# Patient Record
Sex: Female | Born: 1940
Health system: Southern US, Community
[De-identification: ages and names within clinical notes are randomized; demographics above are authoritative.]

## PROBLEM LIST (undated history)

## (undated) DIAGNOSIS — I442 Atrioventricular block, complete: Secondary | ICD-10-CM

## (undated) HISTORY — DX: Atrioventricular block, complete: I44.2

---

## 2012-06-29 DEATH — deceased

## 2015-03-15 ENCOUNTER — Ambulatory Visit (INDEPENDENT_AMBULATORY_CARE_PROVIDER_SITE_OTHER): Payer: Medicare HMO | Admitting: Sports Medicine

## 2015-03-15 ENCOUNTER — Ambulatory Visit (INDEPENDENT_AMBULATORY_CARE_PROVIDER_SITE_OTHER): Payer: Medicare HMO

## 2015-03-15 ENCOUNTER — Encounter: Payer: Self-pay | Admitting: Sports Medicine

## 2015-03-15 VITALS — BP 152/88 | HR 68 | Ht 64.0 in | Wt 139.0 lb

## 2015-03-15 DIAGNOSIS — M25511 Pain in right shoulder: Secondary | ICD-10-CM

## 2015-03-15 DIAGNOSIS — M8580 Other specified disorders of bone density and structure, unspecified site: Secondary | ICD-10-CM

## 2015-03-15 DIAGNOSIS — M810 Age-related osteoporosis without current pathological fracture: Secondary | ICD-10-CM

## 2015-03-15 MED ORDER — MELOXICAM 15 MG PO TABS
ORAL_TABLET | ORAL | Status: DC
Start: 2015-03-15 — End: 2017-10-06

## 2015-03-15 MED ORDER — CALCIUM CARBONATE-VITAMIN D 600-400 MG-UNIT PO TABS: 1.0000 | ORAL_TABLET | Freq: Two times a day (BID) | ORAL | Status: AC

## 2015-03-15 NOTE — Progress Notes (Unsigned)
Patient called back this afternoon asking if she couldn't bowl in her tournament on tomorrow. She stated that she does have a physical therapy here on Tuesday. I read her the chart review: We are going to proceed with formal physical therapy, I have asked her to avoid bowling for the next week. She sound very hesitate if she wouldn't bowl. I asked her Please not to that she may cause herself more damage  & that would present a whole other set of problems which depending on the damage could include surgery. Then she replied that she would not bowl & wait to start her physical therapy on tuesday

## 2015-03-15 NOTE — Progress Notes (Signed)
   Subjective:    I'm seeing this patient as a consultation for:  Dr. Paris Lore  CC: Right shoulder pain  HPI: This is an exquisitely pleasant 74 year old female avid bowler, she bowls in a league. A couple of days ago during the forward stroke/acceleration phase she felt a pop in her superior and anterior shoulder with resultant pain, and persistent mechanical symptoms, catching, popping. Pain is moderate, persistent, and localized anteriorly. No radicular symptoms, no neck pain.  Past medical history, Surgical history, Family history not pertinant except as noted below, Social history, Allergies, and medications have been entered into the medical record, reviewed, and no changes needed.   Review of Systems: No headache, visual changes, nausea, vomiting, diarrhea, constipation, dizziness, abdominal pain, skin rash, fevers, chills, night sweats, weight loss, swollen lymph nodes, body aches, joint swelling, muscle aches, chest pain, shortness of breath, mood changes, visual or auditory hallucinations.   Objective:   General: Well Developed, well nourished, and in no acute distress.  Neuro/Psych: Alert and oriented x3, extra-ocular muscles intact, able to move all 4 extremities, sensation grossly intact. Skin: Warm and dry, no rashes noted.  Respiratory: Not using accessory muscles, speaking in full sentences, trachea midline.  Cardiovascular: Pulses palpable, no extremity edema. Abdomen: Does not appear distended. Right Shoulder: Inspection reveals no abnormalities, atrophy or asymmetry. Palpation is normal with no tenderness over AC joint or bicipital groove. ROM is full in all planes. Rotator cuff strength normal throughout. No signs of impingement with negative Neer and Hawkin's tests, empty can. Speeds and Yergason's tests positive. No labral pathology noted with negative Obrien's, negative crank, negative clunk, and good stability. Normal scapular function observed. No painful  arc and no drop arm sign. No apprehension sign  X-rays reviewed and do show some fraying at the supraspinatus insertion, there is also some glenohumeral and acromioclavicular osteoarthritis.  Bone density test is in the range of osteopenia.  Impression and Recommendations:   This case required medical decision making of moderate complexity.

## 2015-03-15 NOTE — Assessment & Plan Note (Signed)
Symptoms are referable to the biceps tendon, there are some mechanical symptoms suggesting a superior labral tear. We are going to proceed with formal physical therapy, meloxicam and x-rays. I have asked her to avoid bowling for the next week.

## 2015-03-15 NOTE — Assessment & Plan Note (Signed)
Calcium and vitamin D, follow-up with PCP.

## 2015-03-21 ENCOUNTER — Ambulatory Visit: Payer: Medicare HMO | Admitting: Physical Therapy

## 2015-03-23 ENCOUNTER — Telehealth: Payer: Self-pay | Admitting: Sports Medicine

## 2015-03-23 NOTE — Telephone Encounter (Signed)
Rebecca Ramsey lets see if she would be amenable to home health PT coming to her house.  Please let me know.
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
## 2015-03-27 ENCOUNTER — Telehealth: Payer: Self-pay | Admitting: Sports Medicine

## 2015-03-27 NOTE — Telephone Encounter (Signed)
Left message on patient vm to call back to let me know if she wants to dome home PT. Shandel Busic,CMA

## 2015-03-27 NOTE — Telephone Encounter (Signed)
Patient called said she is returning your call and that she never got a call back in regard to her results and that she does not want physical therapy and does not know what she needs it for? She wouldl ike a call back from Dr. Mcneil Sober nurse. Thanks

## 2015-03-28 NOTE — Telephone Encounter (Signed)
SPOKE TO PATIENT AND SHE STATED THAT SHE DOES NOT WANT PHYSICAL THERAPY AT THIS TIME. Adriaan Maltese,CMA

## 2015-04-10 ENCOUNTER — Ambulatory Visit (INDEPENDENT_AMBULATORY_CARE_PROVIDER_SITE_OTHER): Payer: Medicare HMO | Admitting: Sports Medicine

## 2015-04-10 ENCOUNTER — Encounter: Payer: Self-pay | Admitting: Sports Medicine

## 2015-04-10 VITALS — BP 133/81 | HR 67 | Ht 64.0 in | Wt 138.0 lb

## 2015-04-10 DIAGNOSIS — M8580 Other specified disorders of bone density and structure, unspecified site: Secondary | ICD-10-CM

## 2015-04-10 DIAGNOSIS — M25511 Pain in right shoulder: Secondary | ICD-10-CM

## 2015-04-10 MED ORDER — DICLOFENAC SODIUM 2 % TD SOLN: 2.0000 | Freq: Two times a day (BID) | TRANSDERMAL | Status: DC

## 2015-04-10 NOTE — Assessment & Plan Note (Signed)
This very avid bowler has done extremely well, and is essentially pain-free. Symptoms did resemble biceps tendinitis. We are going to use topical diclofenac. Return to see me in one month, injection if no better.

## 2015-04-10 NOTE — Progress Notes (Signed)
  Subjective:    CC: follow-up  HPI: This pleasant 74 year old female bowler comes back with regards to her right shoulder pain, initially symptoms resembled biceps tendinitis, with physical therapy and oral anti-inflammatories all of her pain resolved, and she is doing well.  Osteopenia: Noted on recent bone density test, she has already started her calcium and vitamin D supplementation.  Past medical history, Surgical history, Family history not pertinant except as noted below, Social history, Allergies, and medications have been entered into the medical record, reviewed, and no changes needed.   Review of Systems: No fevers, chills, night sweats, weight loss, chest pain, or shortness of breath.   Objective:    General: Well Developed, well nourished, and in no acute distress.  Neuro: Alert and oriented x3, extra-ocular muscles intact, sensation grossly intact.  HEENT: Normocephalic, atraumatic, pupils equal round reactive to light, neck supple, no masses, no lymphadenopathy, thyroid nonpalpable.  Skin: Warm and dry, no rashes. Cardiac: Regular rate and rhythm, no murmurs rubs or gallops, no lower extremity edema.  Respiratory: Clear to auscultation bilaterally. Not using accessory muscles, speaking in full sentences.  Impression and Recommendations:

## 2015-04-10 NOTE — Assessment & Plan Note (Signed)
Continue calcium and vitamin D supplementation twice a day.

## 2015-04-12 ENCOUNTER — Ambulatory Visit: Payer: Medicare HMO | Admitting: Sports Medicine

## 2015-05-09 ENCOUNTER — Ambulatory Visit: Payer: Medicare HMO | Admitting: Sports Medicine

## 2015-06-27 ENCOUNTER — Other Ambulatory Visit: Payer: Self-pay | Admitting: *Deleted

## 2015-06-27 DIAGNOSIS — M25511 Pain in right shoulder: Secondary | ICD-10-CM

## 2015-06-27 MED ORDER — DICLOFENAC SODIUM 2 % TD SOLN: 2.0000 | Freq: Two times a day (BID) | TRANSDERMAL | Status: DC

## 2015-07-10 ENCOUNTER — Ambulatory Visit (INDEPENDENT_AMBULATORY_CARE_PROVIDER_SITE_OTHER): Payer: Medicare HMO | Admitting: Sports Medicine

## 2015-07-10 ENCOUNTER — Encounter: Payer: Self-pay | Admitting: Sports Medicine

## 2015-07-10 VITALS — BP 147/85 | HR 77 | Ht 64.0 in | Wt 139.0 lb

## 2015-07-10 DIAGNOSIS — M25511 Pain in right shoulder: Secondary | ICD-10-CM | POA: Diagnosis not present

## 2015-07-10 NOTE — Assessment & Plan Note (Addendum)
Pain is predominantly impingement related today. Subacromial injection per patient request. Home rehabilitation exercises given. Return in one month.  After further poking and prodding the patient agrees to formal physical therapy.

## 2015-07-10 NOTE — Progress Notes (Signed)
  Subjective:    CC: right shoulder pain  HPI: Persistent pain over the deltoid anteriorly with overhead activities, worse after an episode bowling. Pain is moderate, persistent without radiation. She desires injection and declines any form of rehabilitation.  Past medical history, Surgical history, Family history not pertinant except as noted below, Social history, Allergies, and medications have been entered into the medical record, reviewed, and no changes needed.   Review of Systems: No fevers, chills, night sweats, weight loss, chest pain, or shortness of breath.   Objective:    General: Well Developed, well nourished, and in no acute distress.  Neuro: Alert and oriented x3, extra-ocular muscles intact, sensation grossly intact.  HEENT: Normocephalic, atraumatic, pupils equal round reactive to light, neck supple, no masses, no lymphadenopathy, thyroid nonpalpable.  Skin: Warm and dry, no rashes. Cardiac: Regular rate and rhythm, no murmurs rubs or gallops, no lower extremity edema.  Respiratory: Clear to auscultation bilaterally. Not using accessory muscles, speaking in full sentences. Right Shoulder: Inspection reveals no abnormalities, atrophy or asymmetry. Palpation is normal with no tenderness over AC joint or bicipital groove. Rotator cuff strength is weak to abduction particularly Rotator cuff strength normal throughout. positiveNeer and Hawkin's tests, empty can. Positive speed's test, negative Yergason test No labral pathology noted with negative Obrien's, negative crank, negative clunk, and good stability. Normal scapular function observed. No painful arc and no drop arm sign. No apprehension sign  Procedure: Real-time Ultrasound Guided Injection of right subacromial bursa Device: GE Logiq E  Verbal informed consent obtained.  Time-out conducted.  Noted no overlying erythema, induration, or other signs of local infection.  Skin prepped in a sterile fashion.  Local  anesthesia: Topical Ethyl chloride.  With sterile technique and under real time ultrasound guidance:  1 mL kenalog 40, 3 mL lidocaine injected easily. Completed without difficulty  Pain immediately resolved suggesting accurate placement of the medication.  Advised to call if fevers/chills, erythema, induration, drainage, or persistent bleeding.  Images permanently stored and available for review in the ultrasound unit.  Impression: Technically successful ultrasound guided injection.  Impression and Recommendations:

## 2015-07-10 NOTE — Addendum Note (Signed)
Addended by: Silverio Decamp on: 07/10/2015 02:00 PM   Modules accepted: Orders

## 2015-07-11 ENCOUNTER — Other Ambulatory Visit: Payer: Self-pay | Admitting: Sports Medicine

## 2015-07-11 DIAGNOSIS — M25511 Pain in right shoulder: Secondary | ICD-10-CM

## 2015-08-07 ENCOUNTER — Ambulatory Visit: Payer: Medicare HMO | Admitting: Sports Medicine

## 2017-01-29 DIAGNOSIS — Z0000 Encounter for general adult medical examination without abnormal findings: Secondary | ICD-10-CM | POA: Diagnosis not present

## 2017-01-29 DIAGNOSIS — Z79899 Other long term (current) drug therapy: Secondary | ICD-10-CM | POA: Diagnosis not present

## 2017-01-29 DIAGNOSIS — Z1321 Encounter for screening for nutritional disorder: Secondary | ICD-10-CM | POA: Diagnosis not present

## 2017-01-29 DIAGNOSIS — R319 Hematuria, unspecified: Secondary | ICD-10-CM | POA: Diagnosis not present

## 2017-01-29 DIAGNOSIS — Z7984 Long term (current) use of oral hypoglycemic drugs: Secondary | ICD-10-CM | POA: Diagnosis not present

## 2017-01-29 DIAGNOSIS — I10 Essential (primary) hypertension: Secondary | ICD-10-CM | POA: Diagnosis not present

## 2017-01-29 DIAGNOSIS — F419 Anxiety disorder, unspecified: Secondary | ICD-10-CM | POA: Diagnosis not present

## 2017-01-29 DIAGNOSIS — E1165 Type 2 diabetes mellitus with hyperglycemia: Secondary | ICD-10-CM | POA: Diagnosis not present

## 2017-01-29 DIAGNOSIS — F329 Major depressive disorder, single episode, unspecified: Secondary | ICD-10-CM | POA: Diagnosis not present

## 2017-02-10 DIAGNOSIS — Z79899 Other long term (current) drug therapy: Secondary | ICD-10-CM | POA: Diagnosis not present

## 2017-02-10 DIAGNOSIS — I10 Essential (primary) hypertension: Secondary | ICD-10-CM | POA: Diagnosis not present

## 2017-02-10 DIAGNOSIS — F419 Anxiety disorder, unspecified: Secondary | ICD-10-CM | POA: Diagnosis not present

## 2017-02-10 DIAGNOSIS — E1165 Type 2 diabetes mellitus with hyperglycemia: Secondary | ICD-10-CM | POA: Diagnosis not present

## 2017-02-10 DIAGNOSIS — Z7984 Long term (current) use of oral hypoglycemic drugs: Secondary | ICD-10-CM | POA: Diagnosis not present

## 2017-03-27 DIAGNOSIS — I10 Essential (primary) hypertension: Secondary | ICD-10-CM | POA: Diagnosis not present

## 2017-03-27 DIAGNOSIS — Z6823 Body mass index (BMI) 23.0-23.9, adult: Secondary | ICD-10-CM | POA: Diagnosis not present

## 2017-03-27 DIAGNOSIS — Z79899 Other long term (current) drug therapy: Secondary | ICD-10-CM | POA: Diagnosis not present

## 2017-03-27 DIAGNOSIS — F4310 Post-traumatic stress disorder, unspecified: Secondary | ICD-10-CM | POA: Diagnosis not present

## 2017-03-27 DIAGNOSIS — Z7984 Long term (current) use of oral hypoglycemic drugs: Secondary | ICD-10-CM | POA: Diagnosis not present

## 2017-03-27 DIAGNOSIS — F419 Anxiety disorder, unspecified: Secondary | ICD-10-CM | POA: Diagnosis not present

## 2017-03-27 DIAGNOSIS — E1165 Type 2 diabetes mellitus with hyperglycemia: Secondary | ICD-10-CM | POA: Diagnosis not present

## 2017-05-22 DIAGNOSIS — E559 Vitamin D deficiency, unspecified: Secondary | ICD-10-CM | POA: Diagnosis not present

## 2017-05-22 DIAGNOSIS — M25519 Pain in unspecified shoulder: Secondary | ICD-10-CM | POA: Diagnosis not present

## 2017-05-22 DIAGNOSIS — E538 Deficiency of other specified B group vitamins: Secondary | ICD-10-CM | POA: Diagnosis not present

## 2017-05-22 DIAGNOSIS — E782 Mixed hyperlipidemia: Secondary | ICD-10-CM | POA: Diagnosis not present

## 2017-05-22 DIAGNOSIS — F419 Anxiety disorder, unspecified: Secondary | ICD-10-CM | POA: Diagnosis not present

## 2017-05-22 DIAGNOSIS — E1165 Type 2 diabetes mellitus with hyperglycemia: Secondary | ICD-10-CM | POA: Diagnosis not present

## 2017-05-22 DIAGNOSIS — I10 Essential (primary) hypertension: Secondary | ICD-10-CM | POA: Diagnosis not present

## 2017-05-22 DIAGNOSIS — F4310 Post-traumatic stress disorder, unspecified: Secondary | ICD-10-CM | POA: Diagnosis not present

## 2017-05-22 DIAGNOSIS — F329 Major depressive disorder, single episode, unspecified: Secondary | ICD-10-CM | POA: Diagnosis not present

## 2017-05-29 ENCOUNTER — Ambulatory Visit: Payer: Medicare HMO | Admitting: Sports Medicine

## 2017-09-10 DIAGNOSIS — R011 Cardiac murmur, unspecified: Secondary | ICD-10-CM | POA: Diagnosis not present

## 2017-09-10 DIAGNOSIS — R079 Chest pain, unspecified: Secondary | ICD-10-CM | POA: Diagnosis not present

## 2017-10-01 DIAGNOSIS — R079 Chest pain, unspecified: Secondary | ICD-10-CM | POA: Diagnosis not present

## 2017-10-01 DIAGNOSIS — I517 Cardiomegaly: Secondary | ICD-10-CM | POA: Diagnosis not present

## 2017-10-01 DIAGNOSIS — I519 Heart disease, unspecified: Secondary | ICD-10-CM | POA: Diagnosis not present

## 2017-10-01 DIAGNOSIS — I081 Rheumatic disorders of both mitral and tricuspid valves: Secondary | ICD-10-CM | POA: Diagnosis not present

## 2017-10-06 ENCOUNTER — Ambulatory Visit (INDEPENDENT_AMBULATORY_CARE_PROVIDER_SITE_OTHER): Payer: Medicare HMO

## 2017-10-06 ENCOUNTER — Encounter: Payer: Self-pay | Admitting: Sports Medicine

## 2017-10-06 ENCOUNTER — Ambulatory Visit (INDEPENDENT_AMBULATORY_CARE_PROVIDER_SITE_OTHER): Payer: Medicare HMO | Admitting: Sports Medicine

## 2017-10-06 ENCOUNTER — Other Ambulatory Visit: Payer: Self-pay | Admitting: Sports Medicine

## 2017-10-06 DIAGNOSIS — M47814 Spondylosis without myelopathy or radiculopathy, thoracic region: Secondary | ICD-10-CM | POA: Diagnosis not present

## 2017-10-06 DIAGNOSIS — G8929 Other chronic pain: Secondary | ICD-10-CM

## 2017-10-06 DIAGNOSIS — M47812 Spondylosis without myelopathy or radiculopathy, cervical region: Secondary | ICD-10-CM | POA: Diagnosis not present

## 2017-10-06 DIAGNOSIS — M8580 Other specified disorders of bone density and structure, unspecified site: Secondary | ICD-10-CM | POA: Diagnosis not present

## 2017-10-06 DIAGNOSIS — M40203 Unspecified kyphosis, cervicothoracic region: Secondary | ICD-10-CM

## 2017-10-06 DIAGNOSIS — M25511 Pain in right shoulder: Secondary | ICD-10-CM

## 2017-10-06 DIAGNOSIS — M40209 Unspecified kyphosis, site unspecified: Secondary | ICD-10-CM | POA: Insufficient documentation

## 2017-10-06 MED ORDER — MELOXICAM 15 MG PO TABS: 15.0000 mg | ORAL_TABLET | Freq: Every day | ORAL | 3 refills | Status: DC | PRN

## 2017-10-06 NOTE — Assessment & Plan Note (Signed)
Likely idiopathic/age-related. Cervical spine and thoracic spine x-rays, DEXA scan, formal physical therapy. Meloxicam.

## 2017-10-06 NOTE — Assessment & Plan Note (Signed)
Continues with calcium and vitamin D supplementation. Repeating DEXA scan, previous scan was 2-1/2 years ago.

## 2017-10-06 NOTE — Progress Notes (Signed)
  Subjective:    CC: Kyphosis  HPI:  This is a pleasant 76 year old female, I saw her a couple of years ago for shoulder impingement syndrome that resolved after an injection and some therapy.  Over the past several years she's noted increasing kyphosis, only minimal pain at the base of her skull, otherwise nothing radicular, no constitutional symptoms, no trauma. We did a bone density test about 2 years ago that showed osteopenia and she has been consistent with calcium and vitamin D supplementation since then. She has no other concerns or complaints.  Past medical history:  Negative.  See flowsheet/record as well for more information.  Surgical history: Negative.  See flowsheet/record as well for more information.  Family history: Negative.  See flowsheet/record as well for more information.  Social history: Negative.  See flowsheet/record as well for more information.  Allergies, and medications have been entered into the medical record, reviewed, and no changes needed.   Review of Systems: No fevers, chills, night sweats, weight loss, chest pain, or shortness of breath.   Objective:    General: Well Developed, well nourished, and in no acute distress.  Neuro: Alert and oriented x3, extra-ocular muscles intact, sensation grossly intact.  HEENT: Normocephalic, atraumatic, pupils equal round reactive to light, neck supple, no masses, no lymphadenopathy, thyroid nonpalpable.  Skin: Warm and dry, no rashes. Cardiac: Regular rate and rhythm, no murmurs rubs or gallops, no lower extremity edema.  Respiratory: Clear to auscultation bilaterally. Not using accessory muscles, speaking in full sentences. Neck: Negative spurling's Full neck range of motion Grip strength and sensation normal in bilateral hands Strength good C4 to T1 distribution No sensory change to C4 to T1 Reflexes normal Visibly there is the expected cervical thoracic kyphosis for a patient of her age.  Impression and  Recommendations:    Osteopenia Continues with calcium and vitamin D supplementation. Repeating DEXA scan, previous scan was 2-1/2 years ago.  Kyphosis Likely idiopathic/age-related. Cervical spine and thoracic spine x-rays, DEXA scan, formal physical therapy. Meloxicam.  ___________________________________________ Gwen Her. Dianah Field, M.D., ABFM., CAQSM. Primary Care and [place] Instructor of [place] of [facility] of Medicine

## 2017-10-08 ENCOUNTER — Ambulatory Visit (INDEPENDENT_AMBULATORY_CARE_PROVIDER_SITE_OTHER): Payer: Medicare HMO

## 2017-10-08 DIAGNOSIS — Z780 Asymptomatic menopausal state: Secondary | ICD-10-CM | POA: Diagnosis not present

## 2017-10-08 DIAGNOSIS — M8580 Other specified disorders of bone density and structure, unspecified site: Secondary | ICD-10-CM

## 2017-10-08 DIAGNOSIS — Z1231 Encounter for screening mammogram for malignant neoplasm of breast: Secondary | ICD-10-CM | POA: Diagnosis not present

## 2017-10-08 DIAGNOSIS — M85851 Other specified disorders of bone density and structure, right thigh: Secondary | ICD-10-CM | POA: Diagnosis not present

## 2017-10-09 ENCOUNTER — Ambulatory Visit (INDEPENDENT_AMBULATORY_CARE_PROVIDER_SITE_OTHER): Payer: Medicare HMO | Admitting: Sports Medicine

## 2017-10-09 ENCOUNTER — Encounter: Payer: Self-pay | Admitting: Sports Medicine

## 2017-10-09 DIAGNOSIS — M8580 Other specified disorders of bone density and structure, unspecified site: Secondary | ICD-10-CM | POA: Diagnosis not present

## 2017-10-09 DIAGNOSIS — M40203 Unspecified kyphosis, cervicothoracic region: Secondary | ICD-10-CM

## 2017-10-09 NOTE — Progress Notes (Signed)
  Subjective:    CC: Follow-up  HPI: Not really sure why Rebecca Ramsey is here, we went over all her stuff on the phone. She does want to go over all of her x-rays, and DEXA scan results.  Past medical history:  Negative.  See flowsheet/record as well for more information.  Surgical history: Negative.  See flowsheet/record as well for more information.  Family history: Negative.  See flowsheet/record as well for more information.  Social history: Negative.  See flowsheet/record as well for more information.  Allergies, and medications have been entered into the medical record, reviewed, and no changes needed.   Review of Systems: No fevers, chills, night sweats, weight loss, chest pain, or shortness of breath.   Objective:    General: Well Developed, well nourished, and in no acute distress.  Neuro: Alert and oriented x3, extra-ocular muscles intact, sensation grossly intact.  HEENT: Normocephalic, atraumatic, pupils equal round reactive to light, neck supple, no masses, no lymphadenopathy, thyroid nonpalpable.  Skin: Warm and dry, no rashes. Cardiac: Regular rate and rhythm, no murmurs rubs or gallops, no lower extremity edema.  Respiratory: Clear to auscultation bilaterally. Not using accessory muscles, speaking in full sentences.  We went over results.  Impression and Recommendations:    Kyphosis Expected age-related kyphoscoliosis. Multiple degenerative changes as expected on x-rays, osteopenia on DEXA scan. She will do age-appropriate calcium and vitamin D supplementation, adding physical therapy near where she lives in [place]:. Return as needed.  Osteopenia Continue calcium and vitamin D supplementation. Repeat DEXA scan in 2 years.  ___________________________________________ Gwen Her. Dianah Field, M.D., ABFM., CAQSM. Primary Care and [place] Instructor of [place] of [facility] of  Medicine

## 2017-10-09 NOTE — Assessment & Plan Note (Addendum)
Expected age-related kyphoscoliosis. Multiple degenerative changes as expected on x-rays, osteopenia on DEXA scan. She will do age-appropriate calcium and vitamin D supplementation, adding physical therapy near where she lives in [place]:. Return as needed.

## 2017-10-09 NOTE — Assessment & Plan Note (Signed)
Continue calcium and vitamin D supplementation. Repeat DEXA scan in 2 years.

## 2018-02-10 DIAGNOSIS — I10 Essential (primary) hypertension: Secondary | ICD-10-CM | POA: Diagnosis not present

## 2018-02-10 DIAGNOSIS — E1165 Type 2 diabetes mellitus with hyperglycemia: Secondary | ICD-10-CM | POA: Diagnosis not present

## 2018-02-10 DIAGNOSIS — E559 Vitamin D deficiency, unspecified: Secondary | ICD-10-CM | POA: Diagnosis not present

## 2018-02-10 DIAGNOSIS — E782 Mixed hyperlipidemia: Secondary | ICD-10-CM | POA: Diagnosis not present

## 2018-02-10 DIAGNOSIS — Z79899 Other long term (current) drug therapy: Secondary | ICD-10-CM | POA: Diagnosis not present

## 2018-02-10 DIAGNOSIS — E538 Deficiency of other specified B group vitamins: Secondary | ICD-10-CM | POA: Diagnosis not present

## 2018-02-26 DIAGNOSIS — E782 Mixed hyperlipidemia: Secondary | ICD-10-CM | POA: Diagnosis not present

## 2018-02-26 DIAGNOSIS — F4310 Post-traumatic stress disorder, unspecified: Secondary | ICD-10-CM | POA: Diagnosis not present

## 2018-02-26 DIAGNOSIS — E1165 Type 2 diabetes mellitus with hyperglycemia: Secondary | ICD-10-CM | POA: Diagnosis not present

## 2018-02-26 DIAGNOSIS — Z0000 Encounter for general adult medical examination without abnormal findings: Secondary | ICD-10-CM | POA: Diagnosis not present

## 2018-02-26 DIAGNOSIS — E559 Vitamin D deficiency, unspecified: Secondary | ICD-10-CM | POA: Diagnosis not present

## 2018-02-26 DIAGNOSIS — E538 Deficiency of other specified B group vitamins: Secondary | ICD-10-CM | POA: Diagnosis not present

## 2018-03-04 DIAGNOSIS — L720 Epidermal cyst: Secondary | ICD-10-CM | POA: Diagnosis not present

## 2018-03-04 DIAGNOSIS — N952 Postmenopausal atrophic vaginitis: Secondary | ICD-10-CM | POA: Diagnosis not present

## 2018-03-24 DIAGNOSIS — D3132 Benign neoplasm of left choroid: Secondary | ICD-10-CM | POA: Diagnosis not present

## 2018-03-24 DIAGNOSIS — E119 Type 2 diabetes mellitus without complications: Secondary | ICD-10-CM | POA: Diagnosis not present

## 2018-03-24 DIAGNOSIS — H52223 Regular astigmatism, bilateral: Secondary | ICD-10-CM | POA: Diagnosis not present

## 2018-03-24 DIAGNOSIS — H524 Presbyopia: Secondary | ICD-10-CM | POA: Diagnosis not present

## 2018-03-24 DIAGNOSIS — Z961 Presence of intraocular lens: Secondary | ICD-10-CM | POA: Diagnosis not present

## 2018-03-24 DIAGNOSIS — Z7984 Long term (current) use of oral hypoglycemic drugs: Secondary | ICD-10-CM | POA: Diagnosis not present

## 2018-03-24 DIAGNOSIS — H5213 Myopia, bilateral: Secondary | ICD-10-CM | POA: Diagnosis not present

## 2019-02-12 ENCOUNTER — Encounter: Payer: Self-pay | Admitting: Sports Medicine

## 2019-02-12 ENCOUNTER — Ambulatory Visit (INDEPENDENT_AMBULATORY_CARE_PROVIDER_SITE_OTHER): Payer: Medicare HMO | Admitting: Sports Medicine

## 2019-02-12 ENCOUNTER — Ambulatory Visit (INDEPENDENT_AMBULATORY_CARE_PROVIDER_SITE_OTHER): Payer: Medicare HMO

## 2019-02-12 DIAGNOSIS — M5416 Radiculopathy, lumbar region: Secondary | ICD-10-CM

## 2019-02-12 NOTE — Assessment & Plan Note (Addendum)
Mild right radicular symptoms, occasionally left. Likely secondary to central canal stenosis. Formal physical therapy in [facility]. Return to see me in 4 weeks. May add gabapentin if insufficient improvement.

## 2019-02-12 NOTE — Progress Notes (Signed)
Subjective:    CC: Right leg pain  HPI: This is a pleasant 78 year old female, for the past several she has had intermittent pain in her right leg radiating from the buttock, down to the calf.  Moderate, persistent, no bowel or bladder dysfunction, saddle numbness, constitutional symptoms, no progressive weakness.  Does desire a somewhat conservative approach here.  I reviewed the past medical history, family history, social history, surgical history, and allergies today and no changes were needed.  Please see the problem list section below in epic for further details.  Past Medical History: Past Medical History:  Diagnosis Date  . Complete heart block [facility])    Past Surgical History: History reviewed. No pertinent surgical history. Social History: Social History   Socioeconomic History  . Marital status: Married    Spouse name: Not on file  . Number of children: Not on file  . Years of education: Not on file  . Highest education level: Not on file  Occupational History  . Not on file  Social Needs  . Financial resource strain: Not on file  . Food insecurity:    Worry: Not on file    Inability: Not on file  . Transportation needs:    Medical: Not on file    Non-medical: Not on file  Tobacco Use  . Smoking status: Never Smoker  . Smokeless tobacco: Never Used  Substance and Sexual Activity  . Alcohol use: Not on file  . Drug use: Not on file  . Sexual activity: Not on file  Lifestyle  . Physical activity:    Days per week: Not on file    Minutes per session: Not on file  . Stress: Not on file  Relationships  . Social connections:    Talks on phone: Not on file    Gets together: Not on file    Attends religious service: Not on file    Active member of club or organization: Not on file    Attends meetings of clubs or organizations: Not on file    Relationship status: Not on file  Other Topics Concern  . Not on file  Social History Narrative  . Not on file    Family History: No family history on file. Allergies: Allergies  Allergen Reactions  . Aleve [Naproxen Sodium] Other (See Comments)    Lip edema  . Other Swelling    Tree nuts   Medications: See med rec.  Review of Systems: No fevers, chills, night sweats, weight loss, chest pain, or shortness of breath.   Objective:    General: Well Developed, well nourished, and in no acute distress.  Neuro: Alert and oriented x3, extra-ocular muscles intact, sensation grossly intact.  HEENT: Normocephalic, atraumatic, pupils equal round reactive to light, neck supple, no masses, no lymphadenopathy, thyroid nonpalpable.  Skin: Warm and dry, no rashes. Cardiac: Regular rate and rhythm, no murmurs rubs or gallops, no lower extremity edema.  Respiratory: Clear to auscultation bilaterally. Not using accessory muscles, speaking in full sentences. Back Exam:  Inspection: Unremarkable  Motion: Flexion 45 deg, Extension 45 deg, Side Bending to 45 deg bilaterally,  Rotation to 45 deg bilaterally  SLR laying: Negative  XSLR laying: Negative  Palpable tenderness: None. FABER: negative. Sensory change: Gross sensation intact to all lumbar and sacral dermatomes.  Reflexes: 2+ at both patellar tendons, 2+ at achilles tendons, Babinski's downgoing.  Strength at foot  Plantar-flexion: 5/5 Dorsi-flexion: 5/5 Eversion: 5/5 Inversion: 5/5  Leg strength  Quad: 5/5 Hamstring: 5/5  Hip flexor: 5/5 Hip abductors: 5/5  Gait unremarkable.  Impression and Recommendations:    Lumbar radiculopathy, right Mild right radicular symptoms, occasionally left. Likely secondary to central canal stenosis. Formal physical therapy in [facility]. Return to see me in 4 weeks. May add gabapentin if insufficient improvement. ___________________________________________ Gwen Her. Dianah Field, M.D., ABFM., CAQSM. Primary Care and Sports Medicine [facility]  Adjunct Professor of [place] of [facility] of Medicine

## 2019-02-15 ENCOUNTER — Ambulatory Visit: Payer: Medicare HMO | Admitting: Sports Medicine

## 2019-02-17 ENCOUNTER — Other Ambulatory Visit: Payer: Self-pay | Admitting: Sports Medicine

## 2019-02-17 MED ORDER — GABAPENTIN 300 MG PO CAPS: ORAL_CAPSULE | ORAL | 3 refills | Status: DC

## 2019-03-15 ENCOUNTER — Ambulatory Visit: Payer: Medicare HMO | Admitting: Sports Medicine

## 2019-03-19 ENCOUNTER — Other Ambulatory Visit: Payer: Self-pay | Admitting: Sports Medicine

## 2019-03-25 ENCOUNTER — Other Ambulatory Visit: Payer: Self-pay | Admitting: Sports Medicine

## 2020-03-31 IMAGING — DX DG LUMBAR SPINE COMPLETE 4+V
5 series · 5 of 5 positions shown · non-contrast
Comparison: None.

CLINICAL DATA: Pt states that she has been having pain in her right
lower leg that radiates up into her right hip and around to the
backside a little. Pt states it comes and goes and has been getting
worse the past week. No known injury. Pt states she was not having
any lower back pain just the leg pain.

EXAM:
LUMBAR SPINE - COMPLETE 4+ VIEW

[l-spine ap]
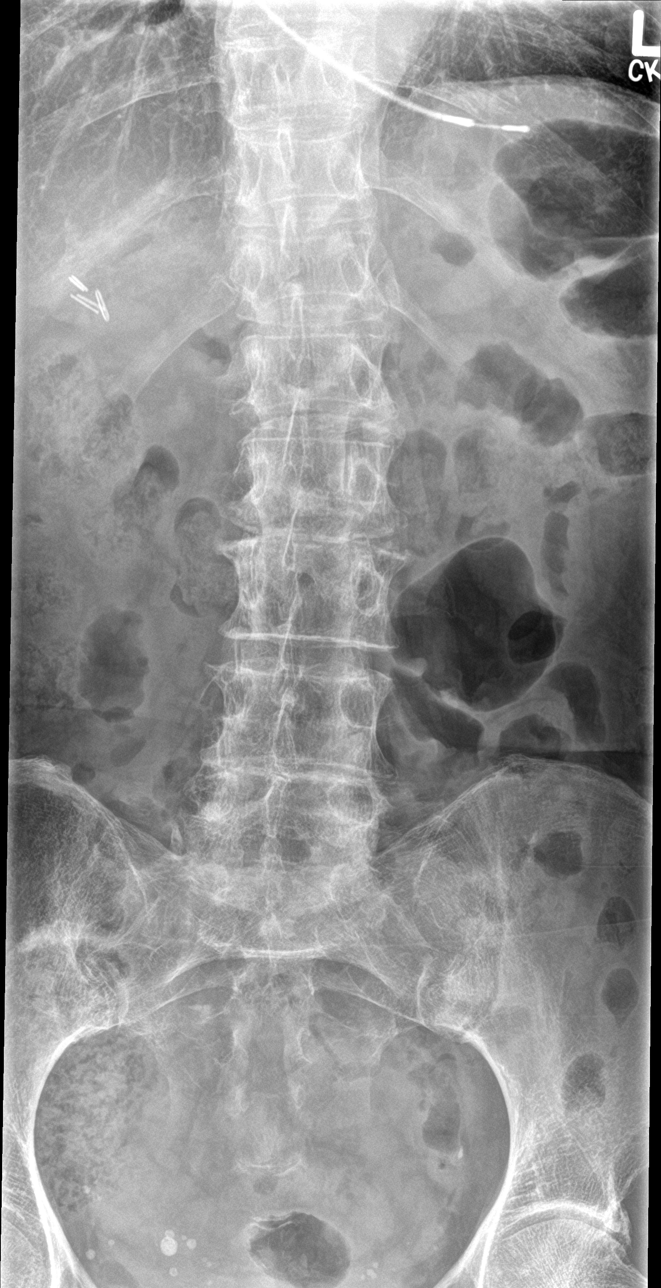

[l-spine obl (1 of 2)]
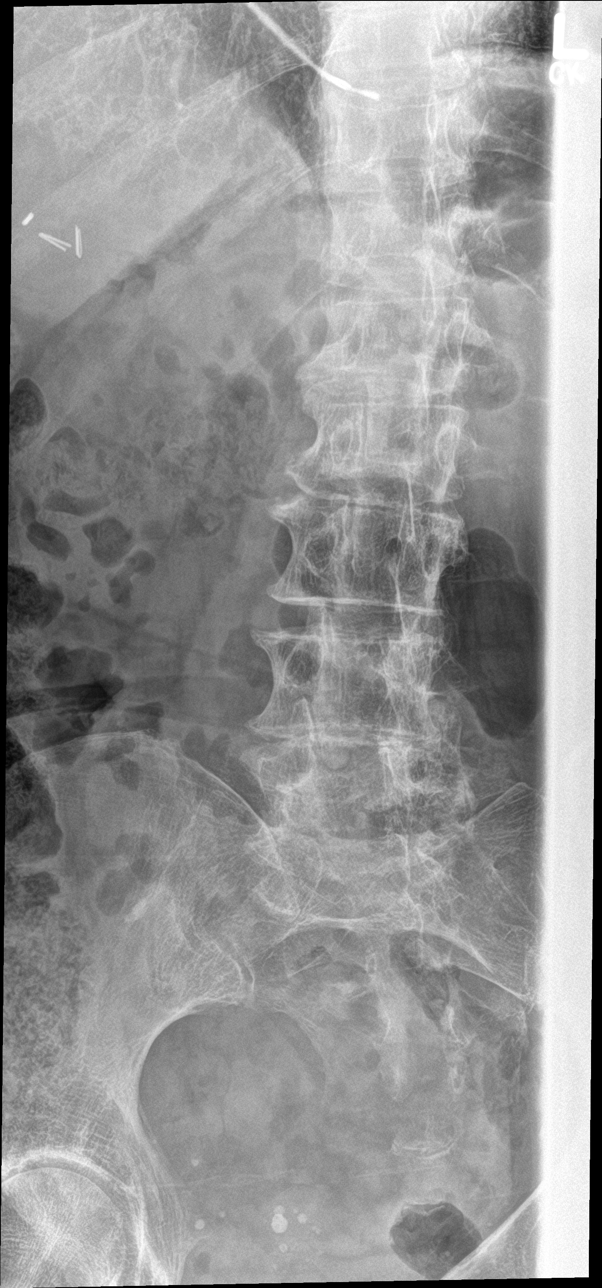

[l-spine obl (2 of 2)]
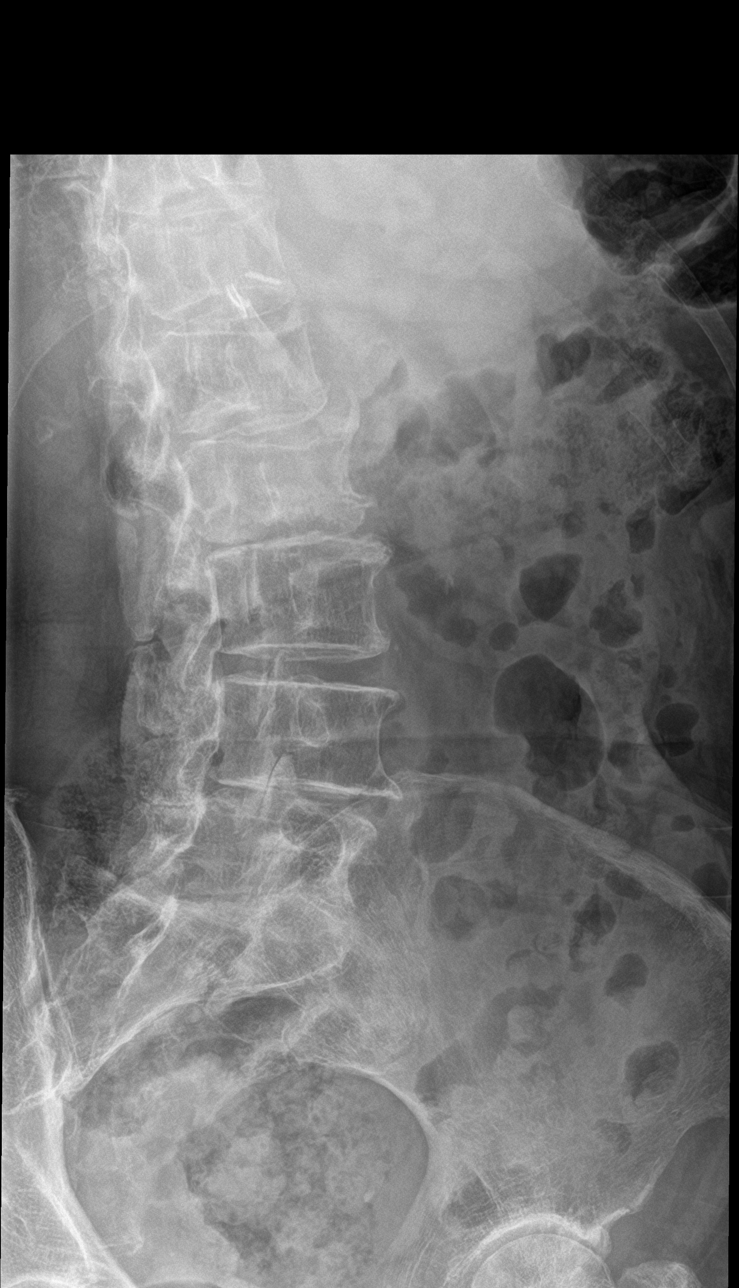

[l-spine lat]
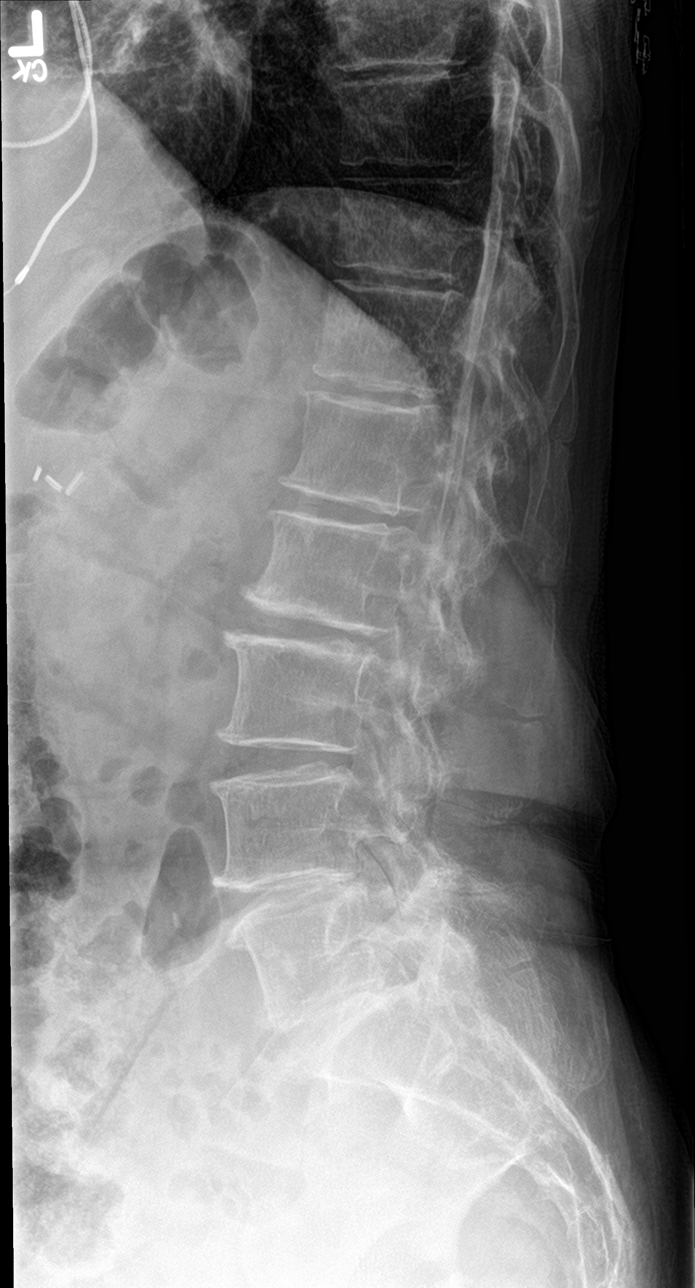

[l-spine spot]
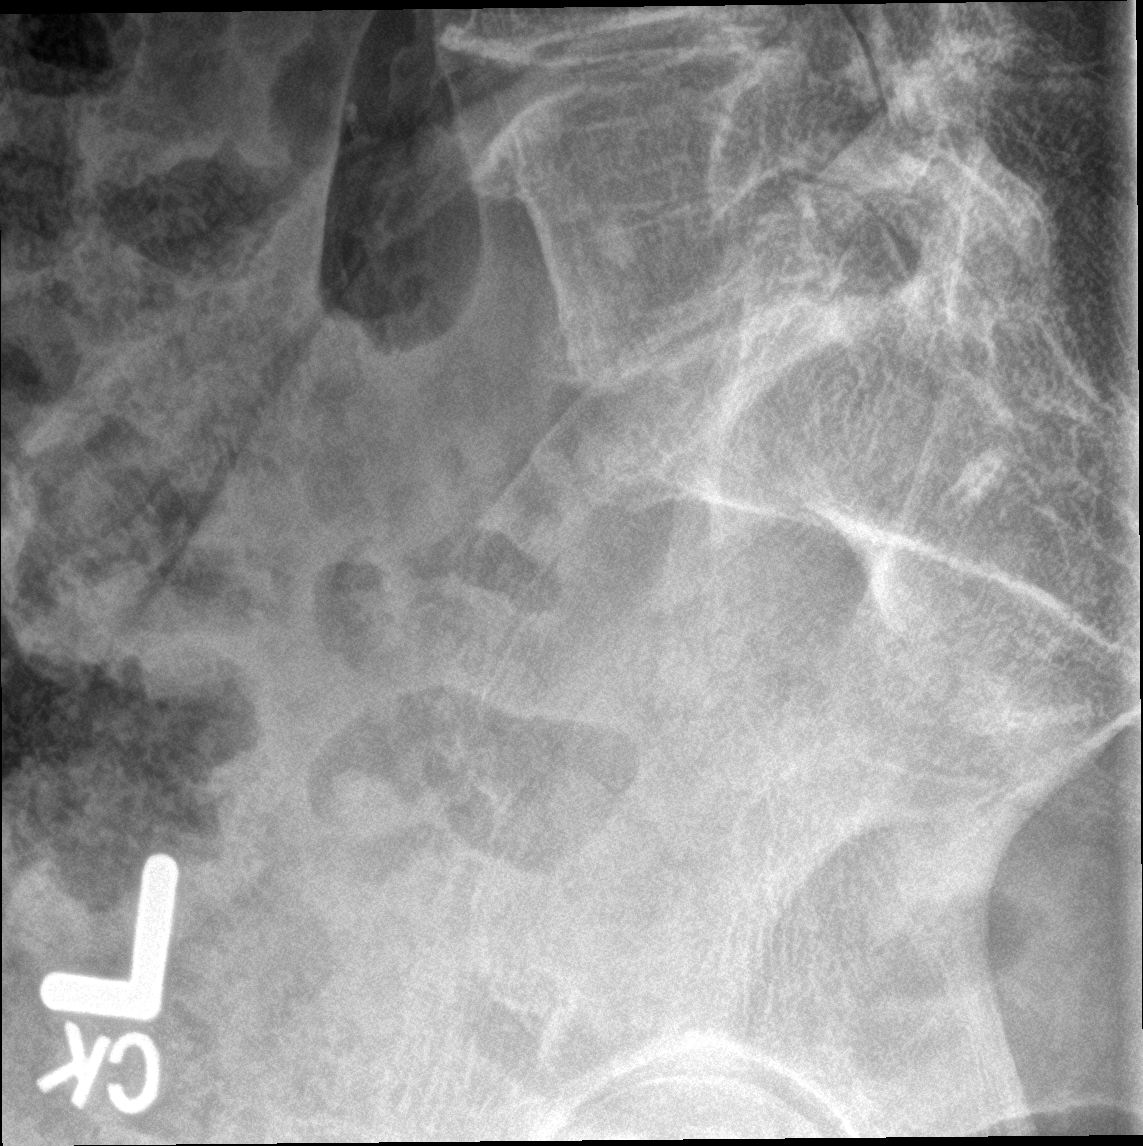

[5 of 5 positions shown; findings below may reference images not displayed]

FINDINGS: No fracture or bone lesion.  No spondylolisthesis.

Moderate loss of disc height at L4-L5 and L5-S1. Endplate spurring
noted throughout the lumbar spine.

Facet degenerative changes are noted bilaterally at L4-L5 and L5-S1,
greater on the left.

Mild curvature, convex the left, apex at L1-L2.

Bones are diffusely demineralized.

Soft tissues are unremarkable.
IMPRESSION: 1. No fracture or acute finding.
2. Disc and facet degenerative changes most evident at L4-L5 and
L5-S1.

## 2021-04-25 ENCOUNTER — Ambulatory Visit: Payer: Medicare HMO | Admitting: Sports Medicine

## 2021-05-23 ENCOUNTER — Ambulatory Visit: Payer: Medicare HMO | Admitting: Sports Medicine

## 2023-06-23 ENCOUNTER — Ambulatory Visit (INDEPENDENT_AMBULATORY_CARE_PROVIDER_SITE_OTHER): Payer: Medicare HMO

## 2023-06-23 ENCOUNTER — Ambulatory Visit (INDEPENDENT_AMBULATORY_CARE_PROVIDER_SITE_OTHER): Payer: Medicare HMO | Admitting: Sports Medicine

## 2023-06-23 DIAGNOSIS — M5416 Radiculopathy, lumbar region: Secondary | ICD-10-CM | POA: Diagnosis not present

## 2023-06-23 NOTE — Assessment & Plan Note (Signed)
This is a pleasant-year-old female, she has a history of right-sided radicular symptoms, she has axial back pain with radiation down the back of the right leg, lower leg to the ankle. I explained her that we have treated this approximately 10 years ago with physical therapy. She was adamant that this is not coming from her spine, and she requested x-rays of her leg. I explained her we may see some arthritic changes but this is not likely the cause of her symptoms. I am going to order her x-rays per her request, I am also get a print out conditioning for her neck and her low back. She can return to see me as needed. She wants no medications and no injections.

## 2023-06-23 NOTE — Progress Notes (Signed)
    Procedures performed today:    None.  Independent interpretation of notes and tests performed by another provider:   None.  Brief History, Exam, Impression, and Recommendations:    Lumbar radiculopathy, right This is a pleasant-year-old female, she has a history of right-sided radicular symptoms, she has axial back pain with radiation down the back of the right leg, lower leg to the ankle. I explained her that we have treated this approximately 10 years ago with physical therapy. She was adamant that this is not coming from her spine, and she requested x-rays of her leg. I explained her we may see some arthritic changes but this is not likely the cause of her symptoms. I am going to order her x-rays per her request, I am also get a print out conditioning for her neck and her low back. She can return to see me as needed. She wants no medications and no injections.  I spent 45 minutes of total time managing this patient today, this includes chart review, face to face, and non-face to face time.  She spoke extensively about all of her other medical problems taking up the majority of the time.  ____________________________________________ Ihor Austin. Benjamin Stain, M.D., ABFM., CAQSM., AME. Primary Care and Sports Medicine [facility]  Adjunct Professor of Family Medicine  [place] of [facility] of Medicine  Restaurant manager, fast food

## 2023-06-30 DIAGNOSIS — Z950 Presence of cardiac pacemaker: Secondary | ICD-10-CM | POA: Diagnosis not present

## 2023-06-30 DIAGNOSIS — I442 Atrioventricular block, complete: Secondary | ICD-10-CM | POA: Diagnosis not present

## 2023-08-25 ENCOUNTER — Ambulatory Visit: Payer: Medicare HMO | Admitting: Sports Medicine

## 2023-09-24 DIAGNOSIS — E119 Type 2 diabetes mellitus without complications: Secondary | ICD-10-CM | POA: Diagnosis not present

## 2023-09-30 DIAGNOSIS — I442 Atrioventricular block, complete: Secondary | ICD-10-CM | POA: Diagnosis not present

## 2023-09-30 DIAGNOSIS — Z950 Presence of cardiac pacemaker: Secondary | ICD-10-CM | POA: Diagnosis not present

## 2023-10-17 DIAGNOSIS — I442 Atrioventricular block, complete: Secondary | ICD-10-CM | POA: Diagnosis not present

## 2023-10-17 DIAGNOSIS — I4729 Other ventricular tachycardia: Secondary | ICD-10-CM | POA: Diagnosis not present

## 2023-10-17 DIAGNOSIS — Z1330 Encounter for screening examination for mental health and behavioral disorders, unspecified: Secondary | ICD-10-CM | POA: Diagnosis not present

## 2023-10-17 DIAGNOSIS — I10 Essential (primary) hypertension: Secondary | ICD-10-CM | POA: Diagnosis not present

## 2023-10-17 DIAGNOSIS — R011 Cardiac murmur, unspecified: Secondary | ICD-10-CM | POA: Diagnosis not present

## 2023-10-27 DIAGNOSIS — R809 Proteinuria, unspecified: Secondary | ICD-10-CM | POA: Diagnosis not present

## 2023-10-27 DIAGNOSIS — E1129 Type 2 diabetes mellitus with other diabetic kidney complication: Secondary | ICD-10-CM | POA: Diagnosis not present

## 2023-10-27 DIAGNOSIS — E1165 Type 2 diabetes mellitus with hyperglycemia: Secondary | ICD-10-CM | POA: Diagnosis not present

## 2023-10-27 DIAGNOSIS — I10 Essential (primary) hypertension: Secondary | ICD-10-CM | POA: Diagnosis not present

## 2023-10-27 DIAGNOSIS — E7800 Pure hypercholesterolemia, unspecified: Secondary | ICD-10-CM | POA: Diagnosis not present

## 2023-11-19 ENCOUNTER — Telehealth: Payer: Self-pay

## 2023-11-19 NOTE — Patient Outreach (Signed)
Attempted to contact patient regarding care gaps. Left voicemail for patient to return my call at [phone].  Nicholes Rough, CMA Care Guide [employer]

## 2023-12-04 DIAGNOSIS — R011 Cardiac murmur, unspecified: Secondary | ICD-10-CM | POA: Diagnosis not present

## 2024-01-19 DIAGNOSIS — I442 Atrioventricular block, complete: Secondary | ICD-10-CM | POA: Diagnosis not present

## 2024-01-19 DIAGNOSIS — Z950 Presence of cardiac pacemaker: Secondary | ICD-10-CM | POA: Diagnosis not present

## 2024-01-29 DIAGNOSIS — H5213 Myopia, bilateral: Secondary | ICD-10-CM | POA: Diagnosis not present

## 2024-01-29 DIAGNOSIS — E119 Type 2 diabetes mellitus without complications: Secondary | ICD-10-CM | POA: Diagnosis not present

## 2024-01-29 DIAGNOSIS — Z961 Presence of intraocular lens: Secondary | ICD-10-CM | POA: Diagnosis not present

## 2024-01-29 DIAGNOSIS — D3132 Benign neoplasm of left choroid: Secondary | ICD-10-CM | POA: Diagnosis not present

## 2024-01-29 DIAGNOSIS — H04123 Dry eye syndrome of bilateral lacrimal glands: Secondary | ICD-10-CM | POA: Diagnosis not present

## 2024-01-29 DIAGNOSIS — H52223 Regular astigmatism, bilateral: Secondary | ICD-10-CM | POA: Diagnosis not present

## 2024-01-29 DIAGNOSIS — H25811 Combined forms of age-related cataract, right eye: Secondary | ICD-10-CM | POA: Diagnosis not present

## 2024-01-29 DIAGNOSIS — Z7984 Long term (current) use of oral hypoglycemic drugs: Secondary | ICD-10-CM | POA: Diagnosis not present

## 2024-01-29 DIAGNOSIS — H43813 Vitreous degeneration, bilateral: Secondary | ICD-10-CM | POA: Diagnosis not present

## 2024-03-16 DIAGNOSIS — Z1330 Encounter for screening examination for mental health and behavioral disorders, unspecified: Secondary | ICD-10-CM | POA: Diagnosis not present

## 2024-03-16 DIAGNOSIS — D510 Vitamin B12 deficiency anemia due to intrinsic factor deficiency: Secondary | ICD-10-CM | POA: Diagnosis not present

## 2024-03-16 DIAGNOSIS — E7800 Pure hypercholesterolemia, unspecified: Secondary | ICD-10-CM | POA: Diagnosis not present

## 2024-03-16 DIAGNOSIS — I10 Essential (primary) hypertension: Secondary | ICD-10-CM | POA: Diagnosis not present

## 2024-03-16 DIAGNOSIS — E1165 Type 2 diabetes mellitus with hyperglycemia: Secondary | ICD-10-CM | POA: Diagnosis not present

## 2024-06-16 DIAGNOSIS — D1801 Hemangioma of skin and subcutaneous tissue: Secondary | ICD-10-CM | POA: Diagnosis not present

## 2024-06-16 DIAGNOSIS — D485 Neoplasm of uncertain behavior of skin: Secondary | ICD-10-CM | POA: Diagnosis not present

## 2024-06-16 DIAGNOSIS — C44519 Basal cell carcinoma of skin of other part of trunk: Secondary | ICD-10-CM | POA: Diagnosis not present

## 2024-06-16 DIAGNOSIS — L821 Other seborrheic keratosis: Secondary | ICD-10-CM | POA: Diagnosis not present

## 2024-07-17 DIAGNOSIS — I442 Atrioventricular block, complete: Secondary | ICD-10-CM | POA: Diagnosis not present

## 2024-07-17 DIAGNOSIS — Z950 Presence of cardiac pacemaker: Secondary | ICD-10-CM | POA: Diagnosis not present

## 2024-08-09 DIAGNOSIS — E1129 Type 2 diabetes mellitus with other diabetic kidney complication: Secondary | ICD-10-CM | POA: Diagnosis not present

## 2024-08-09 DIAGNOSIS — R809 Proteinuria, unspecified: Secondary | ICD-10-CM | POA: Diagnosis not present

## 2024-08-09 DIAGNOSIS — I10 Essential (primary) hypertension: Secondary | ICD-10-CM | POA: Diagnosis not present

## 2024-08-09 DIAGNOSIS — E7800 Pure hypercholesterolemia, unspecified: Secondary | ICD-10-CM | POA: Diagnosis not present

## 2024-08-09 DIAGNOSIS — R252 Cramp and spasm: Secondary | ICD-10-CM | POA: Diagnosis not present

## 2024-08-09 DIAGNOSIS — E1165 Type 2 diabetes mellitus with hyperglycemia: Secondary | ICD-10-CM | POA: Diagnosis not present

## 2024-09-02 ENCOUNTER — Encounter: Payer: Self-pay | Admitting: Sports Medicine

## 2024-09-30 DIAGNOSIS — K219 Gastro-esophageal reflux disease without esophagitis: Secondary | ICD-10-CM | POA: Diagnosis not present

## 2024-10-08 ENCOUNTER — Ambulatory Visit: Payer: Self-pay

## 2024-10-08 NOTE — Telephone Encounter (Signed)
 FYI Only or Action Required?: FYI only for provider.  Patient was last seen in primary care on 06/23/2023 by Curtis Debby PARAS, MD.  Called Nurse Triage reporting Shoulder Pain.  Symptoms began a week ago.  Interventions attempted: Rest, hydration, or home remedies.  Symptoms are: gradually worsening.  Triage Disposition: See PCP When Office is Open (Within 3 Days)  Patient/caregiver understands and will follow disposition?: Yes     Copied from CRM #[id number]. Topic: Clinical - Red Word Triage >> Oct 08, 2024  5:53 PM Shanda MATSU wrote: Red Word that prompted transfer to Nurse Triage: Patient is reporting pain in right shoulder, occasional pop in shoulder.        Reason for Disposition  [1] MODERATE pain (e.g., interferes with normal activities) AND [2] present > 3 days  Answer Assessment - Initial Assessment Questions Patient used to see Dr. Curtis for her shoulder. I advised of appointment's with other sports medicine providers but she states she would call her PCP and see what she recommends.     1. ONSET: When did the pain start?     1 week ago  2. LOCATION: Where is the pain located?     Right shoulder  3. PAIN: How bad is the pain? (Scale 1-10; or mild, moderate, severe)     Moderate  4. WORK OR EXERCISE: Has there been any recent work or exercise that involved this part of the body?     No 5. CAUSE: What do you think is causing the shoulder pain?     Unsure  6. OTHER SYMPTOMS: Do you have any other symptoms? (e.g., neck pain, swelling, rash, fever, numbness, weakness)     Intermittent popping sound in the shoulder  Protocols used: Shoulder Pain-A-AH

## 2024-10-12 DIAGNOSIS — H66003 Acute suppurative otitis media without spontaneous rupture of ear drum, bilateral: Secondary | ICD-10-CM | POA: Diagnosis not present

## 2024-10-16 DIAGNOSIS — Z950 Presence of cardiac pacemaker: Secondary | ICD-10-CM | POA: Diagnosis not present

## 2024-10-19 DIAGNOSIS — H6123 Impacted cerumen, bilateral: Secondary | ICD-10-CM | POA: Diagnosis not present

## 2024-10-19 DIAGNOSIS — R0989 Other specified symptoms and signs involving the circulatory and respiratory systems: Secondary | ICD-10-CM | POA: Diagnosis not present

## 2024-10-19 DIAGNOSIS — R0982 Postnasal drip: Secondary | ICD-10-CM | POA: Diagnosis not present

## 2024-10-19 DIAGNOSIS — R498 Other voice and resonance disorders: Secondary | ICD-10-CM | POA: Diagnosis not present

## 2024-11-02 DIAGNOSIS — R634 Abnormal weight loss: Secondary | ICD-10-CM | POA: Diagnosis not present

## 2024-11-02 DIAGNOSIS — R1319 Other dysphagia: Secondary | ICD-10-CM | POA: Diagnosis not present

## 2024-11-02 DIAGNOSIS — I10 Essential (primary) hypertension: Secondary | ICD-10-CM | POA: Diagnosis not present

## 2024-11-02 DIAGNOSIS — R1312 Dysphagia, oropharyngeal phase: Secondary | ICD-10-CM | POA: Diagnosis not present

## 2024-11-02 DIAGNOSIS — R490 Dysphonia: Secondary | ICD-10-CM | POA: Diagnosis not present

## 2024-11-02 DIAGNOSIS — K219 Gastro-esophageal reflux disease without esophagitis: Secondary | ICD-10-CM | POA: Diagnosis not present

## 2024-11-10 DIAGNOSIS — R1312 Dysphagia, oropharyngeal phase: Secondary | ICD-10-CM | POA: Diagnosis not present

## 2024-11-10 DIAGNOSIS — R059 Cough, unspecified: Secondary | ICD-10-CM | POA: Diagnosis not present

## 2024-11-12 DIAGNOSIS — R1310 Dysphagia, unspecified: Secondary | ICD-10-CM | POA: Diagnosis not present

## 2024-11-23 DIAGNOSIS — R221 Localized swelling, mass and lump, neck: Secondary | ICD-10-CM | POA: Diagnosis not present

## 2024-11-23 DIAGNOSIS — R1310 Dysphagia, unspecified: Secondary | ICD-10-CM | POA: Diagnosis not present

## 2024-11-30 ENCOUNTER — Ambulatory Visit: Payer: Self-pay

## 2024-11-30 NOTE — Telephone Encounter (Signed)
 FYI Only or Action Required?: FYI only for provider: appointment scheduled on 12/01/24.  Patient was last seen in primary care on 06/23/2023 by Curtis Debby PARAS, MD.  Called Nurse Triage reporting Shoulder Pain.  Symptoms began several weeks ago.  Interventions attempted: Rest, hydration, or home remedies.  Symptoms are: gradually worsening.  Triage Disposition: See PCP When Office is Open (Within 3 Days)  Patient/caregiver understands and will follow disposition?: Yes Reason for Disposition  [1] Unable to use arm at all AND [2] because of shoulder pain or stiffness  Answer Assessment - Initial Assessment Questions 1. ONSET: When did the pain start?     2-3 weeks ago  2. LOCATION: Where is the pain located?     Right shoulder  3. PAIN: How bad is the pain? (Scale 1-10; or mild, moderate, severe)     6/10 I can hardly lift it  4. WORK OR EXERCISE: Has there been any recent work or exercise that involved this part of the body?     Gardening at home  5. CAUSE: What do you think is causing the shoulder pain?     Unsure  6. OTHER SYMPTOMS: Do you have any other symptoms? (e.g., neck pain, swelling, rash, fever, numbness, weakness)     Denies neck pain, fever, chest pain, SOB, numbness, weakness, swelling  7. PREGNANCY: Is there any chance you are pregnant? When was your last menstrual period?     NA  Protocols used: Shoulder Pain-A-AH  Answer Assessment - Initial Assessment Questions 1. ONSET: When did the pain start?     2-3 weeks ago  2. LOCATION: Where is the pain located?     Right shoulder  3. PAIN: How bad is the pain? (Scale 1-10; or mild, moderate, severe)     6/10 I can hardly lift it  4. WORK OR EXERCISE: Has there been any recent work or exercise that involved this part of the body?     Gardening at home  5. CAUSE: What do you think is causing the shoulder pain?     Unsure  6. OTHER SYMPTOMS: Do you have any other  symptoms? (e.g., neck pain, swelling, rash, fever, numbness, weakness)     Denies neck pain, fever, chest pain, SOB, numbness, weakness, swelling  7. PREGNANCY: Is there any chance you are pregnant? When was your last menstrual period?     NA  Protocols used: Shoulder Pain-A-AH  Reason for Disposition  [1] MODERATE pain (e.g., interferes with normal activities) AND [2] present > 3 days  Answer Assessment - Initial Assessment Questions 1. ONSET: When did the pain start?     2-3 weeks ago  2. LOCATION: Where is the pain located?     Right shoulder  3. PAIN: How bad is the pain? (Scale 1-10; or mild, moderate, severe)     6/10 I can hardly lift it  4. WORK OR EXERCISE: Has there been any recent work or exercise that involved this part of the body?     Gardening at home  5. CAUSE: What do you think is causing the shoulder pain?     Unsure  6. OTHER SYMPTOMS: Do you have any other symptoms? (e.g., neck pain, swelling, rash, fever, numbness, weakness)     Denies neck pain, fever, chest pain, SOB, numbness, weakness, swelling  7. PREGNANCY: Is there any chance you are pregnant? When was your last menstrual period?     NA  Protocols used: Shoulder Pain-A-AH  Reason for Disposition  [  1] MODERATE pain (e.g., interferes with normal activities) AND [2] present > 3 days  Answer Assessment - Initial Assessment Questions 1. ONSET: When did the pain start?     2-3 weeks ago  2. LOCATION: Where is the pain located?     Right shoulder  3. PAIN: How bad is the pain? (Scale 1-10; or mild, moderate, severe)     6/10 I can hardly lift it  4. WORK OR EXERCISE: Has there been any recent work or exercise that involved this part of the body?     Gardening at home  5. CAUSE: What do you think is causing the shoulder pain?     Unsure-patient mentions a provider previously mentioned possible rheumatoid; has had trouble with this shoulder for a couple of  years  6. OTHER SYMPTOMS: Do you have any other symptoms? (e.g., neck pain, swelling, rash, fever, numbness, weakness)     Denies neck pain, fever, chest pain, SOB, numbness, weakness, swelling  7. PREGNANCY: Is there any chance you are pregnant? When was your last menstrual period?     NA  Protocols used: Shoulder Pain-A-AH   Copied from CRM [id number]. Topic: Clinical - Red Word Triage >> Nov 30, 2024  1:50 PM Antony S wrote: Red Word that prompted transfer to Nurse Triage: 2 weeks of bad right shoulder pain, can't use arm

## 2024-12-01 ENCOUNTER — Ambulatory Visit: Admitting: Physician Assistant

## 2024-12-01 ENCOUNTER — Ambulatory Visit: Payer: Self-pay

## 2024-12-01 NOTE — Telephone Encounter (Signed)
 Attempted to contact patient to reschedule upcoming appointment on 12/13/24 with Vermell Bologna. Patient was seen previously by Dr. Curtis ( sports meds). Patient may need to see another sports med provider or Dr. Charles for shoulder pain. PCP is an external provider.

## 2024-12-01 NOTE — Telephone Encounter (Signed)
 FYI Only or Action Required?: Action required by provider: request for appointment.  Patient was last seen in primary care on 06/23/2023 by Rebecca Debby PARAS, MD.  Called Nurse Triage reporting Changing appointment.  Symptoms began several weeks ago.  Interventions attempted: Nothing.  Symptoms are: unchanged. Pt. Wants to change appointment, no triage needed.  Triage Disposition: See PCP Within 2 Weeks  Patient/caregiver understands and will follow disposition?: Yes      Copied from CRM [id number]. Topic: Clinical - Red Word Triage >> Dec 01, 2024  8:07 AM Rebecca Ramsey wrote: Red Word that prompted transfer to Nurse Triage: Patient called to reschedule appointment. Patient reports right shoulder pain and is requesting an X-ray. Reason for Disposition  Requesting regular office appointment  Protocols used: Information Only Call - No Triage-A-AH

## 2024-12-13 ENCOUNTER — Ambulatory Visit: Admitting: Physician Assistant

## 2024-12-14 ENCOUNTER — Ambulatory Visit: Admitting: Physician Assistant

## 2024-12-14 ENCOUNTER — Ambulatory Visit

## 2024-12-14 VITALS — BP 140/93 | HR 80 | Ht 63.0 in | Wt 104.0 lb

## 2024-12-14 DIAGNOSIS — M25511 Pain in right shoulder: Secondary | ICD-10-CM

## 2024-12-14 DIAGNOSIS — M19012 Primary osteoarthritis, left shoulder: Secondary | ICD-10-CM | POA: Insufficient documentation

## 2024-12-14 DIAGNOSIS — R636 Underweight: Secondary | ICD-10-CM | POA: Insufficient documentation

## 2024-12-14 NOTE — Progress Notes (Unsigned)
° °  Acute Office Visit  Subjective:     Patient ID: Rebecca Ramsey, female    DOB: 10-08-1941, 83 y.o.   MRN: [id number]  No chief complaint on file.   HPI Patient is in today for ***  ROS     Objective:    There were no vitals taken for this visit. BP Readings from Last 3 Encounters:  02/12/19 (!) 151/74  10/09/17 (!) 152/76  07/10/15 (!) 147/85   Wt Readings from Last 3 Encounters:  02/12/19 119 lb (54 kg)  10/09/17 131 lb (59.4 kg)  10/06/17 131 lb (59.4 kg)      Physical Exam  No results found for any visits on 12/14/24.      Assessment & Plan:   Problem List Items Addressed This Visit       Unprioritized   Right shoulder pain - Primary   Relevant Orders   DG Shoulder Right    No orders of the defined types were placed in this encounter.   No follow-ups on file.  Ell Tiso, PA-C

## 2024-12-14 NOTE — Patient Instructions (Addendum)
 Ibuprofen 400mg  every 8 hours as needed for pain. (Can use childrens liquid)  ICE shoulder 15 minutes 2-3 times a day.   Consider injection with sports medicine. I will place referral.   Shoulder Pain Many things can cause shoulder pain, including: An injury. Moving the shoulder in the same way again and again (overuse). Joint pain (arthritis). Pain can come from: Swelling and irritation (inflammation) of any part of the shoulder. An injury to: The shoulder joint. Tissues that connect muscle to bone (tendons). Tissues that connect bones to each other (ligaments). Bones. Follow these instructions at home: Watch for changes in your symptoms. Let your doctor know about them. Follow these instructions to help with your pain. If you have a sling that can be taken off: Wear the sling as told by your doctor. Take it off only as told by your doctor. Check the skin around the sling every day. Tell your doctor if you see problems. Loosen the sling if your fingers: Tingle. Become numb. Become cold. Keep the sling clean. If the sling is not waterproof: Do not let it get wet. Take the sling off when you shower or bathe. Managing pain, stiffness, and swelling  If told, put ice on the painful area. Put ice in a plastic bag. Place a towel between your skin and the bag. Leave the ice on for 20 minutes, 2-3 times a day. Stop putting ice on if it does not help with the pain. If your skin turns bright red, take off the ice right away to prevent skin damage. The risk of damage is higher if you cannot feel pain, heat, or cold. Squeeze a soft ball or a foam pad as much as possible. This prevents swelling in the shoulder. It also helps to strengthen the arm. General instructions Take over-the-counter and prescription medicines only as told by your doctor. Keep all follow-up visits. This will help you avoid any type of permanent shoulder problems. Contact a doctor if: Your pain gets  worse. Medicine does not help your pain. You have new pain in your arm, hand, or fingers. You loosen your sling and your arm, hand, or fingers: Tingle. Are numb. Are swollen. Get help right away if: Your arm, hand, or fingers turn white or blue. This information is not intended to replace advice given to you by your health care provider. Make sure you discuss any questions you have with your health care provider. Document Revised: 07/19/2022 Document Reviewed: 07/19/2022 Elsevier Patient Education  2024 [employer].

## 2024-12-15 ENCOUNTER — Encounter: Payer: Self-pay | Admitting: Physician Assistant

## 2024-12-15 NOTE — Telephone Encounter (Signed)
 Patient chart shows seen by Vermell Bologna , PA on 12/14/2024
